# Patient Record
Sex: Male | Born: 1945 | Race: White | Hispanic: Yes | Marital: Married | Smoking: Never smoker
Health system: Southern US, Community
[De-identification: ages and names within clinical notes are randomized; demographics above are authoritative.]

## PROBLEM LIST (undated history)

## (undated) DIAGNOSIS — K5790 Diverticulosis of intestine, part unspecified, without perforation or abscess without bleeding: Secondary | ICD-10-CM

## (undated) HISTORY — DX: Diverticulosis of intestine, part unspecified, without perforation or abscess without bleeding: K57.90

---

## 2010-03-22 ENCOUNTER — Emergency Department (HOSPITAL_BASED_OUTPATIENT_CLINIC_OR_DEPARTMENT_OTHER): Admission: EM | Admit: 2010-03-22 | Discharge: 2010-03-22 | Payer: Self-pay | Admitting: Emergency Medicine

## 2010-03-22 ENCOUNTER — Ambulatory Visit: Payer: Self-pay | Admitting: Diagnostic Radiology

## 2010-07-09 LAB — URINALYSIS, ROUTINE W REFLEX MICROSCOPIC
Glucose, UA: NEGATIVE mg/dL
Protein, ur: NEGATIVE mg/dL
Specific Gravity, Urine: 1.035 — ABNORMAL HIGH (ref 1.005–1.030)
Urobilinogen, UA: 0.2 mg/dL (ref 0.0–1.0)

## 2010-07-09 LAB — DIFFERENTIAL
Basophils Absolute: 0 10*3/uL (ref 0.0–0.1)
Basophils Relative: 1 % (ref 0–1)
Eosinophils Relative: 1 % (ref 0–5)
Lymphocytes Relative: 16 % (ref 12–46)
Monocytes Absolute: 0.5 10*3/uL (ref 0.1–1.0)

## 2010-07-09 LAB — CBC
HCT: 48.8 % (ref 39.0–52.0)
MCH: 31.4 pg (ref 26.0–34.0)
MCHC: 34.8 g/dL (ref 30.0–36.0)
MCV: 90 fL (ref 78.0–100.0)
RDW: 12.6 % (ref 11.5–15.5)

## 2010-07-09 LAB — BASIC METABOLIC PANEL
BUN: 18 mg/dL (ref 6–23)
Chloride: 108 mEq/L (ref 96–112)
Glucose, Bld: 119 mg/dL — ABNORMAL HIGH (ref 70–99)
Potassium: 4.4 mEq/L (ref 3.5–5.1)

## 2010-07-09 LAB — URINE MICROSCOPIC-ADD ON

## 2013-04-28 HISTORY — PX: COLONOSCOPY: SHX174

## 2017-05-13 DIAGNOSIS — E663 Overweight: Secondary | ICD-10-CM | POA: Insufficient documentation

## 2017-05-13 DIAGNOSIS — H9193 Unspecified hearing loss, bilateral: Secondary | ICD-10-CM | POA: Insufficient documentation

## 2017-07-15 DIAGNOSIS — H903 Sensorineural hearing loss, bilateral: Secondary | ICD-10-CM | POA: Insufficient documentation

## 2020-04-12 ENCOUNTER — Emergency Department (HOSPITAL_BASED_OUTPATIENT_CLINIC_OR_DEPARTMENT_OTHER): Payer: Medicare Other

## 2020-04-12 ENCOUNTER — Emergency Department (HOSPITAL_BASED_OUTPATIENT_CLINIC_OR_DEPARTMENT_OTHER)
Admission: EM | Admit: 2020-04-12 | Discharge: 2020-04-12 | Disposition: A | Discharge status: Home / Self Care | Payer: Medicare Other | Attending: Emergency Medicine | Admitting: Emergency Medicine

## 2020-04-12 ENCOUNTER — Other Ambulatory Visit: Payer: Self-pay

## 2020-04-12 ENCOUNTER — Encounter (HOSPITAL_BASED_OUTPATIENT_CLINIC_OR_DEPARTMENT_OTHER): Payer: Self-pay | Admitting: Emergency Medicine

## 2020-04-12 ENCOUNTER — Emergency Department (HOSPITAL_BASED_OUTPATIENT_CLINIC_OR_DEPARTMENT_OTHER)
Admit: 2020-04-12 | Discharge: 2020-04-12 | Disposition: A | Discharge status: Home / Self Care | Payer: Medicare Other | Attending: Emergency Medicine | Admitting: Emergency Medicine

## 2020-04-12 DIAGNOSIS — R1013 Epigastric pain: Secondary | ICD-10-CM | POA: Diagnosis not present

## 2020-04-12 DIAGNOSIS — R112 Nausea with vomiting, unspecified: Secondary | ICD-10-CM | POA: Diagnosis not present

## 2020-04-12 LAB — COMPREHENSIVE METABOLIC PANEL
ALT: 20 U/L (ref 0–44)
AST: 17 U/L (ref 15–41)
Albumin: 4 g/dL (ref 3.5–5.0)
Alkaline Phosphatase: 59 U/L (ref 38–126)
Anion gap: 11 (ref 5–15)
BUN: 18 mg/dL (ref 8–23)
CO2: 26 mmol/L (ref 22–32)
Calcium: 9.1 mg/dL (ref 8.9–10.3)
Chloride: 103 mmol/L (ref 98–111)
Creatinine, Ser: 0.99 mg/dL (ref 0.61–1.24)
GFR, Estimated: >60 mL/min (ref >60)
Glucose, Bld: 152 mg/dL — ABNORMAL HIGH (ref 70–99)
Potassium: 3.8 mmol/L (ref 3.5–5.1)
Sodium: 140 mmol/L (ref 135–145)
Total Bilirubin: 0.6 mg/dL (ref 0.3–1.2)
Total Protein: 6.8 g/dL (ref 6.5–8.1)

## 2020-04-12 LAB — CBC WITH DIFFERENTIAL/PLATELET
Abs Immature Granulocytes: 0.04 10*3/uL (ref 0.00–0.07)
Basophils Absolute: 0 10*3/uL (ref 0.0–0.1)
Basophils Relative: 0 %
Eosinophils Absolute: 0.3 10*3/uL (ref 0.0–0.5)
Eosinophils Relative: 3 %
HCT: 43.7 % (ref 39.0–52.0)
Hemoglobin: 15 g/dL (ref 13.0–17.0)
Immature Granulocytes: 0 %
Lymphocytes Relative: 19 %
Lymphs Abs: 2.1 10*3/uL (ref 0.7–4.0)
MCH: 31.2 pg (ref 26.0–34.0)
MCHC: 34.3 g/dL (ref 30.0–36.0)
MCV: 90.9 fL (ref 80.0–100.0)
Monocytes Absolute: 0.9 10*3/uL (ref 0.1–1.0)
Monocytes Relative: 8 %
Neutro Abs: 7.5 10*3/uL (ref 1.7–7.7)
Neutrophils Relative %: 70 %
Platelets: 181 10*3/uL (ref 150–400)
RBC: 4.81 MIL/uL (ref 4.22–5.81)
RDW: 13.1 % (ref 11.5–15.5)
WBC: 10.9 10*3/uL — ABNORMAL HIGH (ref 4.0–10.5)
nRBC: 0 % (ref 0.0–0.2)

## 2020-04-12 LAB — LIPASE, BLOOD: Lipase: 23 U/L (ref 11–51)

## 2020-04-12 MED ORDER — FENTANYL CITRATE (PF) 100 MCG/2ML IJ SOLN
100.0000 ug | Freq: Once | INTRAMUSCULAR | Status: AC
  Administered 2020-04-12: 100 ug via INTRAVENOUS
  Filled 2020-04-12: qty 2

## 2020-04-12 MED ORDER — PANTOPRAZOLE SODIUM 40 MG IV SOLR
40.0000 mg | Freq: Once | INTRAVENOUS | Status: AC
  Administered 2020-04-12: 40 mg via INTRAVENOUS
  Filled 2020-04-12: qty 40

## 2020-04-12 MED ORDER — IOHEXOL 300 MG/ML  SOLN
100.0000 mL | Freq: Once | INTRAMUSCULAR | Status: AC | PRN
  Administered 2020-04-12: 100 mL via INTRAVENOUS

## 2020-04-12 MED ORDER — ONDANSETRON HCL 4 MG/2ML IJ SOLN
4.0000 mg | Freq: Once | INTRAMUSCULAR | Status: AC
  Administered 2020-04-12: 4 mg via INTRAVENOUS
  Filled 2020-04-12: qty 2

## 2020-04-12 MED ORDER — SODIUM CHLORIDE 0.9 % IV BOLUS
1000.0000 mL | Freq: Once | INTRAVENOUS | Status: AC
  Administered 2020-04-12: 1000 mL via INTRAVENOUS

## 2020-04-12 NOTE — ED Notes (Signed)
ED Provider at bedside. 

## 2020-04-12 NOTE — ED Provider Notes (Signed)
[facility] Provider Note: Lowella Dell, MD, FACEP  CSN: [id number] MRN: [id number] ARRIVAL: 04/12/20 at 0229 ROOM: MH09/MH09   CHIEF COMPLAINT  Abdominal Pain   HISTORY OF PRESENT ILLNESS  04/12/20 2:49 AM Douglas Boyd is a 74 y.o. male with epigastric pain since about 7 PM yesterday evening.  He describes the pain as sharp, worse with movement or palpation.  He has had associated nausea and vomiting but no diarrhea or fever.   History reviewed. No pertinent past medical history.  History reviewed. No pertinent surgical history.  Family History  Problem Relation Age of Onset   Diabetes Mother    Hypertension Mother     Social History   Tobacco Use   Smoking status: Never Smoker   Smokeless tobacco: Never Used  Vaping Use   Vaping Use: Never used  Substance Use Topics   Alcohol use: Yes    Comment: oncein a while    Drug use: Never    Prior to Admission medications   Not on File    Allergies Patient has no known allergies.   REVIEW OF SYSTEMS  Negative except as noted here or in the History of Present Illness.   PHYSICAL EXAMINATION  Initial Vital Signs Blood pressure (!) 146/85, pulse 71, temperature 97.6 F (36.4 C), temperature source Oral, resp. rate 20, height 5\' 6"  (1.676 m), weight 78 kg, SpO2 97 %.  Examination General: Well-developed, well-nourished male in no acute distress; appearance consistent with age of record HENT: normocephalic; atraumatic Eyes: pupils equal, round and reactive to light; extraocular muscles intact Neck: supple Heart: regular rate and rhythm Lungs: clear to auscultation bilaterally Abdomen: soft; nondistended; epigastric tenderness; no gallstones seen on bedside ultrasound; no masses or hepatosplenomegaly; bowel sounds present Extremities: No deformity; full range of motion; pulses normal Neurologic: Awake, alert and oriented; motor function intact in all extremities and symmetric; no facial  droop Skin: Warm and dry Psychiatric: Normal mood and affect   RESULTS  Summary of this visit's results, reviewed and interpreted by myself:   EKG Interpretation  Date/Time:  Thursday April 12 2020 02:36:49 EST Ventricular Rate:  73 PR Interval:    QRS Duration: 114 QT Interval:  421 QTC Calculation: 464 R Axis:   63 Text Interpretation: Sinus rhythm Borderline intraventricular conduction delay No previous ECGs available Confirmed by [id number] (Paula Libra) on 04/12/2020 2:51:09 AM      Laboratory Studies: Results for orders placed or performed during the hospital encounter of 04/12/20 (from the past 24 hour(s))  Lipase, blood     Status: None   Collection Time: 04/12/20  3:05 AM  Result Value Ref Range   Lipase 23 11 - 51 U/L  CBC with Differential/Platelet     Status: Abnormal   Collection Time: 04/12/20  3:05 AM  Result Value Ref Range   WBC 10.9 (H) 4.0 - 10.5 K/uL   RBC 4.81 4.22 - 5.81 MIL/uL   Hemoglobin 15.0 13.0 - 17.0 g/dL   HCT 04/14/20 56.2 - 56.3 %   MCV 90.9 80.0 - 100.0 fL   MCH 31.2 26.0 - 34.0 pg   MCHC 34.3 30.0 - 36.0 g/dL   RDW 89.3 73.4 - 28.7 %   Platelets 181 150 - 400 K/uL   nRBC 0.0 0.0 - 0.2 %   Neutrophils Relative % 70 %   Neutro Abs 7.5 1.7 - 7.7 K/uL   Lymphocytes Relative 19 %   Lymphs Abs 2.1 0.7 - 4.0 K/uL  Monocytes Relative 8 %   Monocytes Absolute 0.9 0.1 - 1.0 K/uL   Eosinophils Relative 3 %   Eosinophils Absolute 0.3 0.0 - 0.5 K/uL   Basophils Relative 0 %   Basophils Absolute 0.0 0.0 - 0.1 K/uL   Immature Granulocytes 0 %   Abs Immature Granulocytes 0.04 0.00 - 0.07 K/uL  Comprehensive metabolic panel     Status: Abnormal   Collection Time: 04/12/20  3:05 AM  Result Value Ref Range   Sodium 140 135 - 145 mmol/L   Potassium 3.8 3.5 - 5.1 mmol/L   Chloride 103 98 - 111 mmol/L   CO2 26 22 - 32 mmol/L   Glucose, Bld 152 (H) 70 - 99 mg/dL   BUN 18 8 - 23 mg/dL   Creatinine, Ser 6.30 0.61 - 1.24 mg/dL   Calcium 9.1 8.9 - 16.0  mg/dL   Total Protein 6.8 6.5 - 8.1 g/dL   Albumin 4.0 3.5 - 5.0 g/dL   AST 17 15 - 41 U/L   ALT 20 0 - 44 U/L   Alkaline Phosphatase 59 38 - 126 U/L   Total Bilirubin 0.6 0.3 - 1.2 mg/dL   GFR, Estimated >10 >93 mL/min   Anion gap 11 5 - 15   Imaging Studies: CT ABDOMEN PELVIS W CONTRAST  Result Date: 04/12/2020 CLINICAL DATA:  Epigastric abdominal pain, nausea, vomiting EXAM: CT ABDOMEN AND PELVIS WITH CONTRAST TECHNIQUE: Multidetector CT imaging of the abdomen and pelvis was performed using the standard protocol following bolus administration of intravenous contrast. CONTRAST:  OMNIPAQUE IOHEXOL 300 MG/ML  SOLN COMPARISON:  03/23/2019 FINDINGS: Lower chest: Mild bibasilar atelectasis. Small hiatal hernia. Mild coronary artery calcification. Visualized heart and pericardium are otherwise unremarkable. Hepatobiliary: Mild hepatic steatosis. No enhancing liver lesion. No intra or extrahepatic biliary ductal dilation. Gallbladder unremarkable. Pancreas: Unremarkable Spleen: Unremarkable Adrenals/Urinary Tract: The adrenal glands are unremarkable. The kidneys are normal in size and position. Multiple simple cortical cysts are seen bilaterally. The kidneys are otherwise unremarkable. Submucosal fatty infiltration of the bladder wall anteriorly is again identified, likely the result of remote inflammation. The bladder is otherwise unremarkable. Stomach/Bowel: Severe sigmoid and mild descending colonic diverticulosis. The stomach, small bowel, and large bowel are otherwise unremarkable. No evidence of obstruction or focal inflammation. The appendix is unremarkable. No free intraperitoneal gas or fluid. Vascular/Lymphatic: Mild aortoiliac atherosclerotic calcification. No aortic aneurysm. No pathologic adenopathy within the abdomen and pelvis. Reproductive: Moderate prostatic enlargement. Seminal vesicles unremarkable. Other: Rectum unremarkable.  Tiny fat containing umbilical hernia. Musculoskeletal:  Degenerative changes are seen within the lumbar spine. No lytic or blastic bone lesions are seen. No acute bone abnormality. IMPRESSION: No acute intra-abdominal pathology identified. No definite radiographic explanation for the patient's reported symptoms. Incidental findings as noted above. Aortic Atherosclerosis (ICD10-I70.0). Electronically Signed   By: Helyn Numbers MD   On: 04/12/2020 04:35    ED COURSE and MDM  Nursing notes, initial and subsequent vitals signs, including pulse oximetry, reviewed and interpreted by myself.  Vitals:   04/12/20 0236 04/12/20 0239 04/12/20 0330 04/12/20 0430  BP:  (!) 146/85 113/83 110/77  Pulse:  71 69 66  Resp:  20 20 14   Temp:  97.6 F (36.4 C)    TempSrc:  Oral    SpO2:  97% 95% 99%  Weight: 78 kg     Height: 5\' 6"  (1.676 m)      Medications  sodium chloride 0.9 % bolus 1,000 mL (0 mLs Intravenous Stopped 04/12/20  0341)  ondansetron (ZOFRAN) injection 4 mg (4 mg Intravenous Given 04/12/20 0300)  fentaNYL (SUBLIMAZE) injection 100 mcg (100 mcg Intravenous Given 04/12/20 0300)  pantoprazole (PROTONIX) injection 40 mg (40 mg Intravenous Given 04/12/20 0300)  iohexol (OMNIPAQUE) 300 MG/ML solution 100 mL (100 mLs Intravenous Contrast Given 04/12/20 0410)   4:44 AM Patient and family advised of reassuring CT and laboratory studies.  Differential diagnosis includes gastritis, peptic ulcer disease, acute viral gastrointestinal illness and biliary colic.  I was unable to see any significant gallstones on bedside ultrasound but will schedule a formal ultrasound to get a better study later today.  He states he had a similar episode of pain about 3 weeks ago but it was not as severe and did not last as long.  He is presently pain-free.  PROCEDURES  Procedures   ED DIAGNOSES     ICD-10-CM   1. Epigastric pain  R10.13   2. Nausea and vomiting in adult  R11.2        Paula Libra, MD 04/12/20 [phone]

## 2020-04-12 NOTE — ED Triage Notes (Signed)
Pt is c/o epigastric pain that started yesterday evening around 7pm  Pt states he has had nausea and vomiting with the pain  Denies any radiation to his back   Describes the pain as sharp in nature

## 2020-04-12 NOTE — ED Notes (Signed)
Patient transported to CT 

## 2020-04-14 ENCOUNTER — Emergency Department (HOSPITAL_BASED_OUTPATIENT_CLINIC_OR_DEPARTMENT_OTHER)
Admission: EM | Admit: 2020-04-14 | Discharge: 2020-04-15 | Disposition: A | Discharge status: Home / Self Care | Payer: Medicare Other | Attending: Emergency Medicine | Admitting: Emergency Medicine

## 2020-04-14 ENCOUNTER — Encounter (HOSPITAL_BASED_OUTPATIENT_CLINIC_OR_DEPARTMENT_OTHER): Payer: Self-pay | Admitting: Emergency Medicine

## 2020-04-14 ENCOUNTER — Other Ambulatory Visit: Payer: Self-pay

## 2020-04-14 DIAGNOSIS — R1013 Epigastric pain: Secondary | ICD-10-CM | POA: Insufficient documentation

## 2020-04-14 LAB — CBC
HCT: 43.6 % (ref 39.0–52.0)
Hemoglobin: 15.1 g/dL (ref 13.0–17.0)
MCH: 30.8 pg (ref 26.0–34.0)
MCHC: 34.6 g/dL (ref 30.0–36.0)
MCV: 88.8 fL (ref 80.0–100.0)
Platelets: 228 10*3/uL (ref 150–400)
RBC: 4.91 MIL/uL (ref 4.22–5.81)
RDW: 13.2 % (ref 11.5–15.5)
WBC: 9.9 10*3/uL (ref 4.0–10.5)
nRBC: 0 % (ref 0.0–0.2)

## 2020-04-14 LAB — TROPONIN I (HIGH SENSITIVITY): Troponin I (High Sensitivity): 3 ng/L (ref <18)

## 2020-04-14 LAB — BASIC METABOLIC PANEL
Anion gap: 11 (ref 5–15)
BUN: 14 mg/dL (ref 8–23)
CO2: 23 mmol/L (ref 22–32)
Calcium: 8.7 mg/dL — ABNORMAL LOW (ref 8.9–10.3)
Chloride: 106 mmol/L (ref 98–111)
Creatinine, Ser: 0.88 mg/dL (ref 0.61–1.24)
GFR, Estimated: >60 mL/min (ref >60)
Glucose, Bld: 145 mg/dL — ABNORMAL HIGH (ref 70–99)
Potassium: 3.5 mmol/L (ref 3.5–5.1)
Sodium: 140 mmol/L (ref 135–145)

## 2020-04-14 MED ORDER — ALUM & MAG HYDROXIDE-SIMETH 200-200-20 MG/5ML PO SUSP
30.0000 mL | Freq: Once | ORAL | Status: AC
Start: 2020-04-14 — End: 2020-04-14
  Administered 2020-04-14: 30 mL via ORAL
  Filled 2020-04-14: qty 30

## 2020-04-14 MED ORDER — FENTANYL CITRATE (PF) 100 MCG/2ML IJ SOLN
50.0000 ug | INTRAMUSCULAR | Status: AC | PRN
  Administered 2020-04-14: 50 ug via INTRAVENOUS
  Filled 2020-04-14: qty 2

## 2020-04-14 MED ORDER — FENTANYL CITRATE (PF) 100 MCG/2ML IJ SOLN
INTRAMUSCULAR | Status: AC
  Administered 2020-04-14: 50 ug via INTRAVENOUS
  Filled 2020-04-14: qty 2

## 2020-04-14 MED ORDER — LIDOCAINE VISCOUS HCL 2 % MT SOLN
15.0000 mL | Freq: Once | OROMUCOSAL | Status: AC
  Administered 2020-04-14: 15 mL via ORAL
  Filled 2020-04-14: qty 15

## 2020-04-14 MED ORDER — HYOSCYAMINE SULFATE 0.125 MG SL SUBL
0.2500 mg | SUBLINGUAL_TABLET | Freq: Once | SUBLINGUAL | Status: AC
  Administered 2020-04-14: 0.25 mg via SUBLINGUAL
  Filled 2020-04-14: qty 2

## 2020-04-14 MED ORDER — ONDANSETRON HCL 4 MG/2ML IJ SOLN
4.0000 mg | Freq: Once | INTRAMUSCULAR | Status: AC
  Administered 2020-04-14: 4 mg via INTRAVENOUS

## 2020-04-14 NOTE — ED Triage Notes (Addendum)
Reports epigastric pain onset tonight, reports seen a couple of days ago for the same. Reports worse tonight, diaphoretic, dizzy.

## 2020-04-14 NOTE — ED Notes (Addendum)
Notified MD of pt continuing to endorse CP. Pt vitals stable. Repeated EKG

## 2020-04-14 NOTE — ED Provider Notes (Signed)
[facility] EMERGENCY DEPARTMENT Provider Note  CSN: [id number] Arrival date & time: 04/14/20 2056  Chief Complaint(s) Chest Pain  HPI Douglas Boyd is a 74 y.o. male   HPI Patient presents today for recurrent epigastric abdominal pain. Pain started around 7 PM this afternoon and has been constant since. Sharp and severe in nature Pain does not radiate. No alleviating or aggravating factors. Associated nausea and nonbloody nonbilious emesis. No chest pain or shortness of breath. No diarrhea or change in bowel habits.  No dysuria.  No other physical complaints.  Patient was seen a couple days ago for the same and had reassuring work-up notable only for leukocytosis.  No evidence of biliary obstruction or pancreatitis at that time.  CT scan was unremarkable.  Past Medical History History reviewed. No pertinent past medical history. There are no problems to display for this patient.  Home Medication(s) Prior to Admission medications   Medication Sig Start Date End Date Taking? Authorizing Provider  lidocaine (XYLOCAINE) 2 % solution Use as directed 15 mLs in the mouth or throat every 6 (six) hours as needed for mouth pain. 04/15/20   Nira Conn, MD  ondansetron (ZOFRAN ODT) 4 MG disintegrating tablet Take 1 tablet (4 mg total) by mouth every 8 (eight) hours as needed for up to 3 days for nausea or vomiting. 04/15/20 04/18/20  Marlyne Totaro, Amadeo Garnet, MD  sucralfate (CARAFATE) 1 GM/10ML suspension Take 10 mLs (1 g total) by mouth 4 (four) times daily -  with meals and at bedtime. 04/15/20   Nira Conn, MD                                                                                                                                    Past Surgical History History reviewed. No pertinent surgical history. Family History Family History  Problem Relation Age of Onset  . Diabetes Mother   . Hypertension Mother     Social History Social History    Tobacco Use  . Smoking status: Never Smoker  . Smokeless tobacco: Never Used  Vaping Use  . Vaping Use: Never used  Substance Use Topics  . Alcohol use: Yes    Comment: oncein a while   . Drug use: Never   Allergies Patient has no known allergies.  Review of Systems Review of Systems All other systems are reviewed and are negative for acute change except as noted in the HPI  Physical Exam Vital Signs  I have reviewed the triage vital signs BP 123/83   Pulse 67   Temp 98 F (36.7 C) (Oral)   Resp 18   SpO2 98%   Physical Exam Vitals reviewed.  Constitutional:      General: He is not in acute distress.    Appearance: He is well-developed and well-nourished. He is not diaphoretic.  HENT:     Head: Normocephalic and atraumatic.     Jaw:  No trismus.     Right Ear: External ear normal.     Left Ear: External ear normal.     Nose: Nose normal.     Mouth/Throat:     Mouth: Mucous membranes are normal.  Eyes:     General: No scleral icterus.    Extraocular Movements: EOM normal.     Conjunctiva/sclera: Conjunctivae normal.  Neck:     Trachea: Phonation normal.  Cardiovascular:     Rate and Rhythm: Normal rate and regular rhythm.  Pulmonary:     Effort: Pulmonary effort is normal. No respiratory distress.     Breath sounds: No stridor.  Abdominal:     General: There is no distension.     Tenderness: There is abdominal tenderness in the epigastric area. There is no right CVA tenderness, left CVA tenderness or rebound. Negative signs include Murphy's sign.  Musculoskeletal:        General: No edema. Normal range of motion.     Cervical back: Normal range of motion.  Neurological:     Mental Status: He is alert and oriented to person, place, and time.  Psychiatric:        Mood and Affect: Mood and affect normal.        Behavior: Behavior normal.     ED Results and Treatments Labs (all labs ordered are listed, but only abnormal results are displayed) Labs  Reviewed  BASIC METABOLIC PANEL - Abnormal; Notable for the following components:      Result Value   Glucose, Bld 145 (*)    Calcium 8.7 (*)    All other components within normal limits  HEPATIC FUNCTION PANEL - Abnormal; Notable for the following components:   Bilirubin, Direct 0.3 (*)    All other components within normal limits  CBC  LIPASE, BLOOD  TROPONIN I (HIGH SENSITIVITY)  TROPONIN I (HIGH SENSITIVITY)                                                                                                                         EKG  EKG Interpretation  Date/Time:  Saturday April 14 2020 21:55:51 EST Ventricular Rate:  54 PR Interval:    QRS Duration: 111 QT Interval:  469 QTC Calculation: 445 R Axis:   63 Text Interpretation: Age not entered, assumed to be  74 years old for purpose of ECG interpretation Sinus rhythm RSR' in V1 or V2, probably normal variant Confirmed by Drema Pryardama, Tirth Cothron [phone]([id number]) on 04/14/2020 11:03:37 PM      Radiology CT Angio Chest/Abd/Pel for Dissection W and/or Wo Contrast  Result Date: 04/15/2020 CLINICAL DATA:  Abdominal pain. EXAM: CT ANGIOGRAPHY CHEST, ABDOMEN AND PELVIS TECHNIQUE: Non-contrast CT of the chest was initially obtained. Multidetector CT imaging through the chest, abdomen and pelvis was performed using the standard protocol during bolus administration of intravenous contrast. Multiplanar reconstructed images and MIPs were obtained and reviewed to evaluate the vascular anatomy. CONTRAST:  100mL OMNIPAQUE IOHEXOL 350 MG/ML SOLN COMPARISON:  April 12, 2020 FINDINGS: CTA CHEST FINDINGS Cardiovascular: There is marked severity calcification of the aortic arch. Satisfactory opacification of the pulmonary arteries to the segmental level. No evidence of pulmonary embolism. Normal heart size with moderate to marked severity coronary artery calcification. No pericardial effusion. Mediastinum/Nodes: No enlarged mediastinal, hilar, or axillary lymph  nodes. Thyroid gland, trachea, and esophagus demonstrate no significant findings. Lungs/Pleura: Mild atelectasis is seen within the posterior aspect of the right lower lobe. There is no evidence of a pleural effusion or pneumothorax. Musculoskeletal: No chest wall abnormality. No acute or significant osseous findings. Review of the MIP images confirms the above findings. CTA ABDOMEN AND PELVIS FINDINGS VASCULAR Aorta: Normal caliber aorta without aneurysm, dissection, vasculitis or significant stenosis. Celiac: Patent without evidence of aneurysm, dissection, vasculitis or significant stenosis. SMA: Patent without evidence of aneurysm, dissection, vasculitis or significant stenosis. Renals: Both renal arteries are patent without evidence of aneurysm, dissection, vasculitis, fibromuscular dysplasia or significant stenosis. IMA: Patent without evidence of aneurysm, dissection, vasculitis or significant stenosis. Inflow: Moderate-severity calcification and atherosclerosis without evidence of aneurysm, dissection, vasculitis or significant stenosis. Veins: No obvious venous abnormality within the limitations of this arterial phase study. Review of the MIP images confirms the above findings. NON-VASCULAR Hepatobiliary: No focal liver abnormality is seen. No gallstones, gallbladder wall thickening, or biliary dilatation. Pancreas: Unremarkable. No pancreatic ductal dilatation or surrounding inflammatory changes. Spleen: Normal in size without focal abnormality. Adrenals/Urinary Tract: Adrenal glands are unremarkable. Kidneys are normal in size, without renal calculi or hydronephrosis. A 2.7 cm x 2.3 cm cyst is seen within the lower pole of the right kidney. A 2.4 cm x 2.1 cm exophytic cyst is seen adjacent to the lateral aspect of the mid left kidney. Bladder is unremarkable. Stomach/Bowel: There is a small to moderate sized hiatal hernia. Appendix appears normal. No evidence of bowel wall thickening, distention, or  inflammatory changes. Noninflamed diverticula are seen throughout the sigmoid colon. Lymphatic: No abnormal abdominal or pelvic lymph nodes are identified. Reproductive: There is moderate to marked severity prostate gland enlargement. Other: No abdominal wall hernia or abnormality. No abdominopelvic ascites. Musculoskeletal: Degenerative changes seen throughout the lumbar spine, most prominent at the level of L5-S1. Review of the MIP images confirms the above findings. IMPRESSION: 1. No evidence of pulmonary embolism. 2. Noninflamed diverticula throughout the sigmoid colon. 3. Moderate to marked severity prostate gland enlargement. 4. Aortic atherosclerosis. Aortic Atherosclerosis (ICD10-I70.0). Electronically Signed   By: Aram Candela M.D.   On: 04/15/2020 02:45    Pertinent labs & imaging results that were available during my care of the patient were reviewed by me and considered in my medical decision making (see chart for details).  Medications Ordered in ED Medications  sodium chloride 0.9 % bolus 1,000 mL (0 mLs Intravenous Stopped 04/15/20 0243)    Followed by  0.9 %  sodium chloride infusion (1,000 mLs Intravenous New Bag/Given 04/15/20 0254)  sucralfate (CARAFATE) 1 GM/10ML suspension 1 g (has no administration in time range)  fentaNYL (SUBLIMAZE) injection 50 mcg (50 mcg Intravenous Given 04/14/20 2145)  alum & mag hydroxide-simeth (MAALOX/MYLANTA) 200-200-20 MG/5ML suspension 30 mL (30 mLs Oral Given 04/14/20 2322)    And  lidocaine (XYLOCAINE) 2 % viscous mouth solution 15 mL (15 mLs Oral Given 04/14/20 2322)  hyoscyamine (LEVSIN SL) SL tablet 0.25 mg (0.25 mg Sublingual Given 04/14/20 2321)  ondansetron (ZOFRAN) injection 4 mg (4 mg Intravenous Given 04/14/20 2320)  pantoprazole (PROTONIX) injection 40 mg (40 mg Intravenous Given 04/15/20  0023)  HYDROmorphone (DILAUDID) injection 0.5 mg (0.5 mg Intravenous Given 04/15/20 0023)  HYDROmorphone (DILAUDID) injection 0.5 mg (0.5 mg  Intravenous Given 04/15/20 0200)  iohexol (OMNIPAQUE) 350 MG/ML injection 100 mL (100 mLs Intravenous Contrast Given 04/15/20 0218)                                                                                                                                    Procedures Procedures  (including critical care time)  Medical Decision Making / ED Course I have reviewed the nursing notes for this encounter and the patient's prior records (if available in EHR or on provided paperwork).   Kaysin Brock was evaluated in Emergency Department on 04/15/2020 for the symptoms described in the history of present illness. He was evaluated in the context of the global COVID-19 pandemic, which necessitated consideration that the patient might be at risk for infection with the SARS-CoV-2 virus that causes COVID-19. Institutional protocols and algorithms that pertain to the evaluation of patients at risk for COVID-19 are in a state of rapid change based on information released by regulatory bodies including the CDC and federal and state organizations. These policies and algorithms were followed during the patient's care in the ED.  Recurrent epigastric pain described as sharp Tenderness to palpation in the epigastrium. Likely GI related.  Labs grossly reassuring without leukocytosis which is improved from prior.  No anemia.  No significant electrolyte derangements or renal sufficiency.  No evidence of biliary obstruction or pancreatitis.  Patient treated symptomatically with GI cocktail and antiemetics.  Given age, cardiac work-up was obtained.  EKG without acute ischemic changes or evidence of pericarditis.  Serial troponins negative x2.  I feel that this is sufficient to rule out cardiac process.  Patient reported minimal relief with GI cocktail.  He was given IV narcotics and Protonix. Patient's CT scan during his last visit was relatively unremarkable. Given his persistent pain with minimal relief from  the above, I obtain a CTA to assess for possible celiac artery dissection.  CTA was negative for any acute process.  Patient's pain did improve but did not completely resolved.  I see no evidence of serious process at this time.  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  Patient already has PPI at home.  Will DC home with Carafate, viscous lidocaine and antiemetics.  Recommended PCP and GI follow-up.     Final Clinical Impression(s) / ED Diagnoses Final diagnoses:  Epigastric pain   The patient appears reasonably screened and/or stabilized for discharge and I doubt any other medical condition or other [facility] requiring further screening, evaluation, or treatment in the ED at this time prior to discharge. Safe for discharge with strict return precautions.  Disposition: Discharge  Condition: Good  I have discussed the results, Dx and Tx plan with the patient/family who expressed understanding and agree(s) with the plan. Discharge instructions discussed at length. The patient/family was given strict return precautions  who verbalized understanding of the instructions. No further questions at time of discharge.    ED Discharge Orders         Ordered    sucralfate (CARAFATE) 1 GM/10ML suspension  3 times daily with meals & bedtime        04/15/20 0406    lidocaine (XYLOCAINE) 2 % solution  Every 6 hours PRN        04/15/20 0406    ondansetron (ZOFRAN ODT) 4 MG disintegrating tablet  Every 8 hours PRN        04/15/20 0406           Follow Up: Andreas Blower., MD [address] [place] [address] [phone]  Call  To schedule an appointment for close follow up  Gastroenterology, [place] Sprang [address] [place] [address] [phone]  Call  To schedule an appointment for close follow up      This chart was dictated using voice recognition software.  Despite best efforts to proofread,  errors can occur which can change the documentation meaning.   Nira Conn, MD 04/15/20 [phone]

## 2020-04-15 ENCOUNTER — Emergency Department (HOSPITAL_BASED_OUTPATIENT_CLINIC_OR_DEPARTMENT_OTHER): Payer: Medicare Other

## 2020-04-15 ENCOUNTER — Encounter (HOSPITAL_BASED_OUTPATIENT_CLINIC_OR_DEPARTMENT_OTHER): Payer: Self-pay

## 2020-04-15 DIAGNOSIS — R1013 Epigastric pain: Secondary | ICD-10-CM | POA: Diagnosis not present

## 2020-04-15 LAB — HEPATIC FUNCTION PANEL
ALT: 34 U/L (ref 0–44)
AST: 30 U/L (ref 15–41)
Albumin: 4.1 g/dL (ref 3.5–5.0)
Alkaline Phosphatase: 59 U/L (ref 38–126)
Bilirubin, Direct: 0.3 mg/dL — ABNORMAL HIGH (ref 0.0–0.2)
Indirect Bilirubin: 0.5 mg/dL (ref 0.3–0.9)
Total Bilirubin: 0.8 mg/dL (ref 0.3–1.2)
Total Protein: 6.8 g/dL (ref 6.5–8.1)

## 2020-04-15 LAB — TROPONIN I (HIGH SENSITIVITY): Troponin I (High Sensitivity): 2 ng/L (ref <18)

## 2020-04-15 LAB — LIPASE, BLOOD: Lipase: 20 U/L (ref 11–51)

## 2020-04-15 MED ORDER — ONDANSETRON 4 MG PO TBDP: 4.0000 mg | ORAL_TABLET | Freq: Three times a day (TID) | ORAL | 0 refills | Status: AC | PRN

## 2020-04-15 MED ORDER — SUCRALFATE 1 GM/10ML PO SUSP: 1.0000 g | Freq: Three times a day (TID) | ORAL | 0 refills | Status: DC

## 2020-04-15 MED ORDER — SUCRALFATE 1 GM/10ML PO SUSP: 1.0000 g | Freq: Three times a day (TID) | ORAL | Status: DC

## 2020-04-15 MED ORDER — HYDROMORPHONE HCL 1 MG/ML IJ SOLN
0.5000 mg | Freq: Once | INTRAMUSCULAR | Status: AC
Start: 2020-04-15 — End: 2020-04-15
  Administered 2020-04-15: 0.5 mg via INTRAVENOUS
  Filled 2020-04-15: qty 1

## 2020-04-15 MED ORDER — IOHEXOL 350 MG/ML SOLN
100.0000 mL | Freq: Once | INTRAVENOUS | Status: AC | PRN
  Administered 2020-04-15: 100 mL via INTRAVENOUS

## 2020-04-15 MED ORDER — LIDOCAINE VISCOUS HCL 2 % MT SOLN: 15.0000 mL | Freq: Four times a day (QID) | OROMUCOSAL | 0 refills | Status: DC | PRN

## 2020-04-15 MED ORDER — SODIUM CHLORIDE 0.9 % IV BOLUS (SEPSIS)
1000.0000 mL | Freq: Once | INTRAVENOUS | Status: AC
  Administered 2020-04-15: 1000 mL via INTRAVENOUS

## 2020-04-15 MED ORDER — SODIUM CHLORIDE 0.9 % IV SOLN
1000.0000 mL | INTRAVENOUS | Status: DC
  Administered 2020-04-15: 1000 mL via INTRAVENOUS

## 2020-04-15 MED ORDER — PANTOPRAZOLE SODIUM 40 MG IV SOLR
40.0000 mg | Freq: Once | INTRAVENOUS | Status: AC
  Administered 2020-04-15: 40 mg via INTRAVENOUS
  Filled 2020-04-15: qty 40

## 2020-04-15 MED ORDER — SUCRALFATE 1 GM/10ML PO SUSP
1.0000 g | Freq: Once | ORAL | Status: AC
  Administered 2020-04-15: 1 g via ORAL
  Filled 2020-04-15: qty 10

## 2020-04-15 NOTE — ED Notes (Signed)
Pt continues to endorse 10/10 pain, notified MD, pt family @ bedside.

## 2020-04-15 NOTE — ED Notes (Signed)
Patient transported to CT 

## 2020-04-25 ENCOUNTER — Encounter: Payer: Self-pay | Admitting: Nurse Practitioner

## 2020-05-16 ENCOUNTER — Other Ambulatory Visit: Payer: Self-pay

## 2020-05-16 ENCOUNTER — Encounter: Payer: Self-pay | Admitting: Nurse Practitioner

## 2020-05-16 ENCOUNTER — Ambulatory Visit (INDEPENDENT_AMBULATORY_CARE_PROVIDER_SITE_OTHER): Payer: Medicare Other | Admitting: Nurse Practitioner

## 2020-05-16 VITALS — BP 120/86 | HR 80 | Ht 66.0 in | Wt 171.0 lb

## 2020-05-16 DIAGNOSIS — R1013 Epigastric pain: Secondary | ICD-10-CM | POA: Diagnosis not present

## 2020-05-16 DIAGNOSIS — K219 Gastro-esophageal reflux disease without esophagitis: Secondary | ICD-10-CM

## 2020-05-16 DIAGNOSIS — K828 Other specified diseases of gallbladder: Secondary | ICD-10-CM

## 2020-05-16 MED ORDER — OMEPRAZOLE 20 MG PO CPDR: DELAYED_RELEASE_CAPSULE | ORAL | 6 refills | Status: DC

## 2020-05-16 NOTE — Progress Notes (Addendum)
ASSESSMENT AND PLAN    # 75 yo male with chronic GERD manifested as nocturnal heartburn. Treated himself for years with Tums. Symptoms have resolved after starting Omeprazole in mid December.  --anti-reflux measures, wedge pillow or elevate head of bed --continue daily Omeprazole 20 mg Asked him to take 30 minutes before dinner since symptoms are nocturnal.  --We discussed EGD for Barrett's esophagus screening and also evaluation of recent upper abdominal pain. He declines Barrett's screening for now.  # Epigastric pain, resolved.  Went to ED twice in mid December, EKG, labs, CT scan/CT angiogram unrevealing for source of pain.  However, ultrasound did suggest sludge/debris in the gallbladder. Pain has resolved with omeprazole 20 mg daily.  Etiology of pain is unclear. Peptic? Biliary?   --Patient will call us if he gets recurrent epigastric pain.  He understands it will warrant further evaluation, possibly EGD and /or surgical evaluation given US findings.  Otherwise follow up here in a few months  # Colon cancer screening. Reports normal colonoscopy ~ 5 years ago in [facility]  --Will request records and once reviewed with place him on colonoscopy recall list   ADDENDUM 07/17/20:  Reviewed records from Dr. Conley Rolls in [facility].  Colonoscopy 06/13/2013 by Dr. Conley Rolls Complete exam, excellent prep --No polyps.  Mild sigmoid diverticulosis, grade 1 internal hemorrhoids --Follow-up colonoscopy in 10 years   # Renal cysts on recent imaging.    HISTORY OF PRESENT ILLNESS     Primary Gastroenterologist : new - Marsa Aris, MD  Chief Complaint : upper abdominal pain, now resolved.   Douglas Boyd is a 75 y.o. male with PMH / PSH significant for,  but not necessarily limited to: Severe prostamegaly, diverticulosis, GERD, hiatal hernia, kidney stones, aortic atherosclerosis  Patient referred by Shelbie Proctor, MD. He was seen in the ED in 04/12/20 for epigastric pain. He says he  doesn't know exactly when the pain started. Wife says she thinks it started about two weeks prior to ED visit. The pain got progressively worse over several days.   In ED his Troponin was normal. EKG showed sinus rhythm, borderline intraventricular conduction delay.  His lipase and LFTs were normal, white count 10.9, hemoglobin 15 .  Marland Kitchen RUQ US showed sludge and debris in the gallbladder, a 5 mm gallbladder polyp versus adherent gallstone.  No evidence for acute cholecystitis.  No biliary duct dilation.   No acute findings on CT scan of the abdomen and pelvis with contrast. He was discharged home on a PPI and an antiemetic. Pain improved but recurred two days later and patient returned to the ED on 12/18.  Repeat lipase was still normal.  LFTs were normal except for a trivial elevation in direct bilirubin to 0.3.  Troponin was still normal..  CBC was normal .  CT angio of the chest abdomen and pelvis negative for hepatobiliary findings.  There was moderate to marked coronary artery calcifications, no abnormalities of the pancreas.  He had a small to moderate size hiatal hernia.  Diverticulosis without evidence for diverticulitis patient discharged from the ED with Carafate  Patient points to the epigastrium as the location of where he was having the pain.  He describes radiation of pain across his whole upper abdomen but not into the back.  He recalls being constipated with the upper abdominal pain.  He had not had a bowel movement for 4 days prior to going to ED. He took "something" for constipation after leaving ED  and was able to have a BM.  Since then his BMs have been back to normal and hasn't had any further abdomianl pain. However,he is taking the PPI and has made some dietary changes.  Drinking less coffee, not eating spicy foods. Patient denies NSAID use. He gives a longstanding history of nocturnal heartburn for which he used to take Tums on a daily basis.  Since starting omeprazole he has not needed any  Tums . He has no dysphagia. No prior history of an EGD.    Patient says he had a colonoscopy in [place] about 5 years ago. Told he needed another in 10 years. No FMH of colon cancer.     Past Medical History:  Diagnosis Date  . Diverticulosis      No past surgical history on file. Family History  Problem Relation Age of Onset  . Diabetes Mother   . Hypertension Mother    Social History   Tobacco Use  . Smoking status: Never Smoker  . Smokeless tobacco: Never Used  Vaping Use  . Vaping Use: Never used  Substance Use Topics  . Alcohol use: Yes    Comment: oncein a while   . Drug use: Never   Current Outpatient Medications  Medication Sig Dispense Refill  . alum & mag hydroxide-simeth (MYLANTA) 200-200-20 MG/5ML suspension Take 15 mLs by mouth as needed for indigestion or heartburn.    . lidocaine (XYLOCAINE) 2 % solution Use as directed 15 mLs in the mouth or throat every 6 (six) hours as needed for mouth pain. 100 mL 0  . omeprazole (PRILOSEC) 20 MG capsule Take 20 mg by mouth daily.     No current facility-administered medications for this visit.   No Known Allergies   Review of Systems: Positive for sinus trouble/allergy trouble.  All other systems reviewed and negative except where noted in HPI.   PHYSICAL EXAM :    Wt Readings from Last 3 Encounters:  05/16/20 171 lb (77.6 kg)  04/12/20 172 lb (78 kg)    BP 120/86   Pulse 80   Ht 5\' 6"  (1.676 m)   Wt 171 lb (77.6 kg)   BMI 27.60 kg/m  Constitutional:  Pleasant male in no acute distress. Psychiatric: Normal mood and affect. Behavior is normal. EENT: Pupils normal.  Conjunctivae are normal. No scleral icterus. Neck supple.  Cardiovascular: Normal rate, regular rhythm. No edema Pulmonary/chest: Effort normal and breath sounds normal. No wheezing, rales or rhonchi. Abdominal: Soft, nondistended, nontender. Bowel sounds active throughout. There are no masses palpable. No hepatomegaly. Neurological:  Alert and oriented to person place and time. Skin: Skin is warm and dry. No rashes noted.  , NP  05/16/2020, 11:43 AM  Cc:  Referring Provider 05/18/2020., MD

## 2020-05-16 NOTE — Patient Instructions (Addendum)
If you are age 73 or older, your body mass index should be between 23-30. Your Body mass index is 27.6 kg/m. If this is out of the aforementioned range listed, please consider follow up with your Primary Care Provider.  If you are age 46 or younger, your body mass index should be between 19-25. Your Body mass index is 27.6 kg/m. If this is out of the aformentioned range listed, please consider follow up with your Primary Care Provider.   Continue Omeprazole 20 mg 1 capsule 30 minutes prior to dinner. Refills have been sent to your pharmacy.  Call the office if you have any recurrent heartburn or upper abdominal pain.  Contact the office in a few months to schedule a 6 month follow up.  Thank you for entrusting me with your care and choosing [facility].  Willette Cluster, NP-C

## 2020-06-25 NOTE — Progress Notes (Signed)
Reviewed and agree with documentation and assessment and plan. K. Veena Yen Wandell , MD   

## 2020-12-08 ENCOUNTER — Other Ambulatory Visit: Payer: Self-pay | Admitting: Nurse Practitioner

## 2021-01-16 ENCOUNTER — Ambulatory Visit: Payer: Medicare Other | Admitting: Nurse Practitioner

## 2021-02-20 ENCOUNTER — Ambulatory Visit: Payer: Medicare Other | Admitting: Nurse Practitioner

## 2021-02-28 ENCOUNTER — Ambulatory Visit (INDEPENDENT_AMBULATORY_CARE_PROVIDER_SITE_OTHER): Payer: Medicare Other | Admitting: Nurse Practitioner

## 2021-02-28 ENCOUNTER — Encounter: Payer: Self-pay | Admitting: Nurse Practitioner

## 2021-02-28 VITALS — BP 124/76 | HR 81 | Ht 66.0 in | Wt 167.6 lb

## 2021-02-28 DIAGNOSIS — K219 Gastro-esophageal reflux disease without esophagitis: Secondary | ICD-10-CM

## 2021-02-28 NOTE — Patient Instructions (Signed)
If you are age 73 or older, your body mass index should be between 23-30. Your Body mass index is 27.05 kg/m. If this is out of the aforementioned range listed, please consider follow up with your Primary Care Provider.  The [facility] providers would like to encourage you to use [facility] to communicate with providers for non-urgent requests or questions.  Due to long hold times on the telephone, sending your provider a message by [facility] may be faster and more efficient way to get a response. Please allow 48 business hours for a response.  Please remember that this is for non-urgent requests/questions.  RECOMMENDATIONS: Stop Omeprazole.  Start Pepcid 20 MG daily in the morning.  If your symptoms return, stop the Pepcid and resume the Omeprazole.  Follow up in 1 year or as needed.  It was great seeing you today! Thank you for entrusting me with your care and choosing [facility].  Willette Cluster, NP

## 2021-02-28 NOTE — Progress Notes (Signed)
ASSESSMENT AND PLAN    #75 year old male with chronic GERD /small to moderate size hiatal hernia on CT scan.  manifested as nocturnal pyrosis.  Symptoms controlled after starting omeprazole December 2021  -- Again we discussed antireflux measures.  -- I think it is reasonable to see if GERD symptoms can be controlled with famotidine 20 mg daily   We briefly discussed potential side effects of long-term PPI.  Should he have recurrent GERD symptoms then advised to stop famotidine and resume daily omeprazole 30 minutes before breakfast  -- Again we discussed Barrett's esophagus screening.  He declines --Follow-up in 1 year or sooner if needed  #Colon cancer screening.  Due for 10-year screening colonoscopy February 2025   HISTORY OF PRESENT ILLNESS    Chief Complaint : Follow-up on GERD  Douglas Boyd is a 75 y.o. male known to Dr. Lavon Paganini with a past medical history significant for severe prostamegaly, renal cysts, diverticulosis, GERD, hiatal hernia, kidney stones, aortic atherosclerosis. See PMH below for any additional medical problems.    Patient established care here mid January for evaluation of epigastric pain for which he had been evaluated in the ED the month prior.  ED work-up essentially unremarkable except for an ultrasound suggesting sludge/debris in the gallbladder.  By the time of her visit his pain had already resolved on omeprazole.  Also addressed during the same visit was patient's history of chronic GERD.  He has a long history of nocturnal heartburn treated with Tums.  Symptoms had totally resolved after starting omeprazole the month prior.  I offered him an upper endoscopy for a Barrett's esophagus screening given his longstanding history of GERD but he declined..  I was able to get his colonoscopy report from [facility] done in February 2015 with findings of mild diverticulosis and internal hemorrhoids.  No polyps.  10-year screening colonoscopy recommended  INTERVAL  HISTORY: Mr Douglas Boyd has returned for follow up.  He continues to feel great.  No GERD symptoms.  He never did have a recurrence of epigastric pain.  He has been able to lose a few pounds.  He is still taking omeprazole every morning. He is also drinking a small amount of apple cider vinegar every morning. He goes to bed on an empty stomach.  He does drink 2 to 3 cups of coffee in the morning.     PREVIOUS ENDOSCOPIC EVALUATIONS / PERTINENT STUDIES:   Reviewed records from Dr. Conley Rolls in [facility].  Colonoscopy 06/13/2013 by Dr. Conley Rolls Complete exam, excellent prep --No polyps.  Mild sigmoid diverticulosis, grade 1 internal hemorrhoids --Follow-up colonoscopy in 10 years    Past Medical History:  Diagnosis Date   Diverticulosis     Current Medications, Allergies, Past Surgical History, Family History and Social History were reviewed in [employer] record.   Current Outpatient Medications  Medication Sig Dispense Refill   omeprazole (PRILOSEC) 20 MG capsule TAKE 1 CAPSULE BY MOUTH EVERY DAY 30 MINUTES PRIOR TO DINNER 90 capsule 2   No current facility-administered medications for this visit.    Review of Systems: No chest pain. No shortness of breath. No urinary complaints.   PHYSICAL EXAM :    Wt Readings from Last 3 Encounters:  02/28/21 167 lb 9.6 oz (76 kg)  05/16/20 171 lb (77.6 kg)  04/12/20 172 lb (78 kg)    BP 124/76   Pulse 81   Ht 5\' 6"  (1.676 m)   Wt 167 lb 9.6 oz (76  kg)   SpO2 99%   BMI 27.05 kg/m  Constitutional:  Generally well appearing male in no acute distress. Psychiatric: Pleasant. Normal mood and affect. Behavior is normal. EENT: Pupils normal.  Conjunctivae are normal. No scleral icterus. Neck supple.  Cardiovascular: Normal rate, regular rhythm. No edema Pulmonary/chest: Effort normal and breath sounds normal. No wheezing, rales or rhonchi. Abdominal: Soft, nondistended, nontender. Bowel sounds active throughout. There are no  masses palpable. No hepatomegaly. Neurological: Alert and oriented to person place and time. Skin: Skin is warm and dry. No rashes noted.  Willette Cluster, NP  02/28/2021, 4:59 PM

## 2021-05-07 NOTE — Progress Notes (Signed)
Reviewed and agree with documentation and assessment and plan. K. Veena Kristy Schomburg , MD   

## 2021-11-23 IMAGING — CT CT ABD-PELV W/ CM
2 of 5 series · 16 of 46 positions shown, 18 images · IV contrast (Omnipaque)
Comparison: 03/23/2019

CLINICAL DATA: Epigastric abdominal pain, nausea, vomiting

EXAM:
CT ABDOMEN AND PELVIS WITH CONTRAST
TECHNIQUE: Multidetector CT imaging of the abdomen and pelvis was performed
using the standard protocol following bolus administration of
intravenous contrast.
CONTRAST:  100mL OMNIPAQUE IOHEXOL 300 MG/ML  SOLN

[Series 2: axial st · axial · 0.75mm/px · z∈[+728,+1153]mm · 13 of 95 slices shown, 15 images]
[im 5/95  soft-tissue]
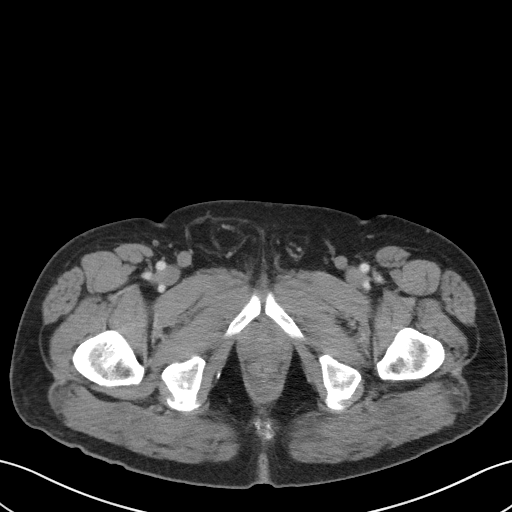
[im 5/95  bone]
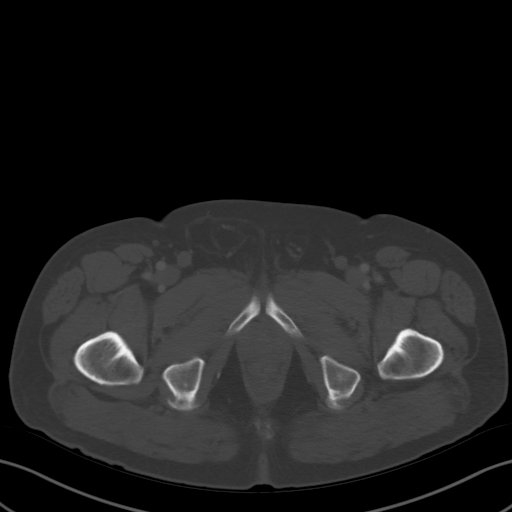
[im 15/95  soft-tissue]
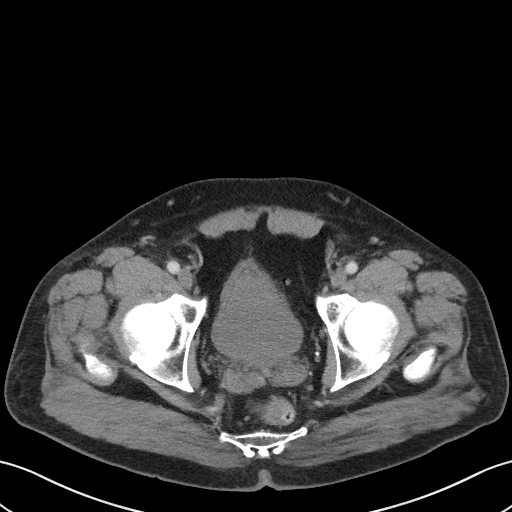
[im 20/95  soft-tissue]
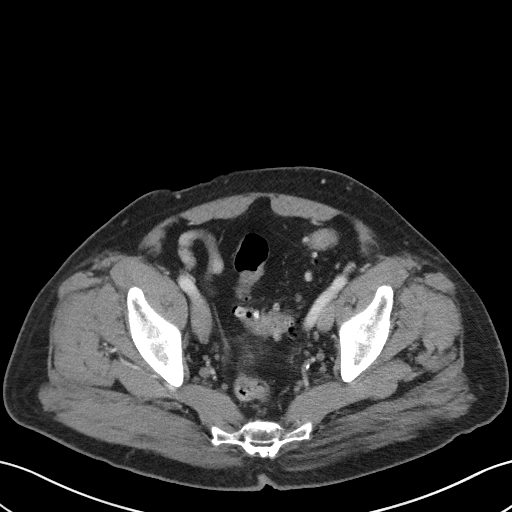
[im 25/95  soft-tissue]
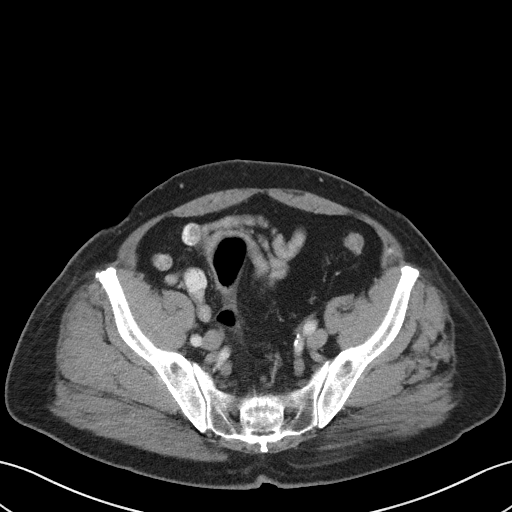
[im 35/95  soft-tissue]
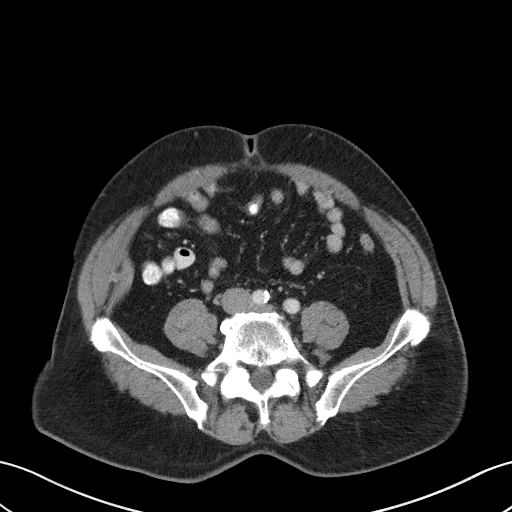
[im 40/95  soft-tissue]
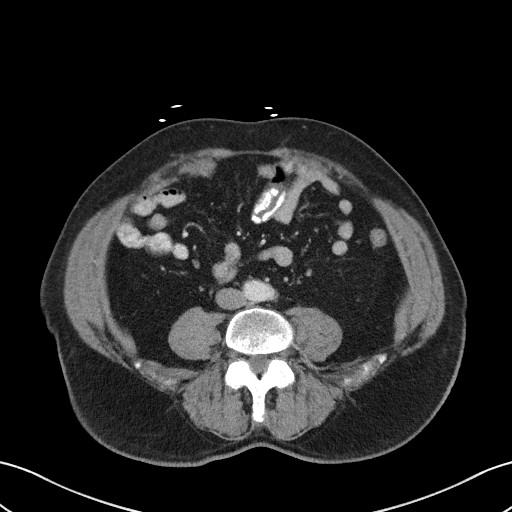
[im 50/95  soft-tissue]
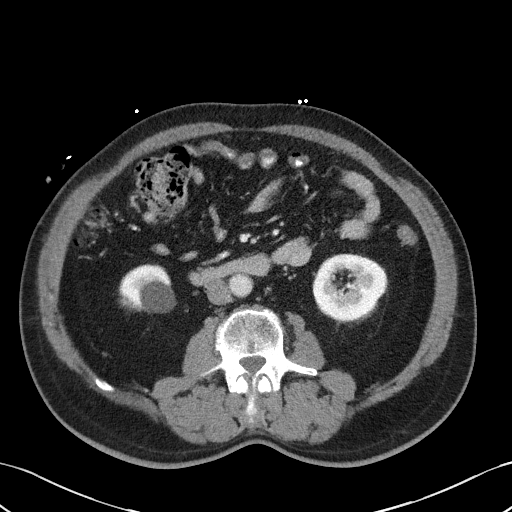
[im 55/95  soft-tissue]
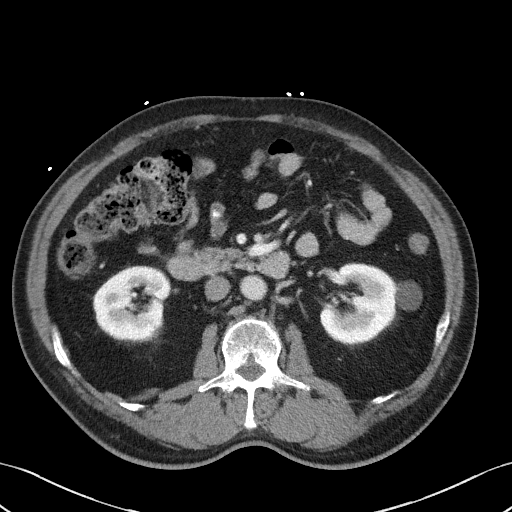
[im 60/95  soft-tissue]
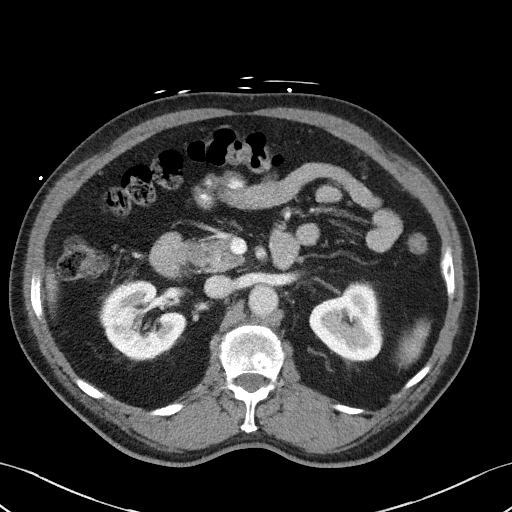
[im 60/95  bone]
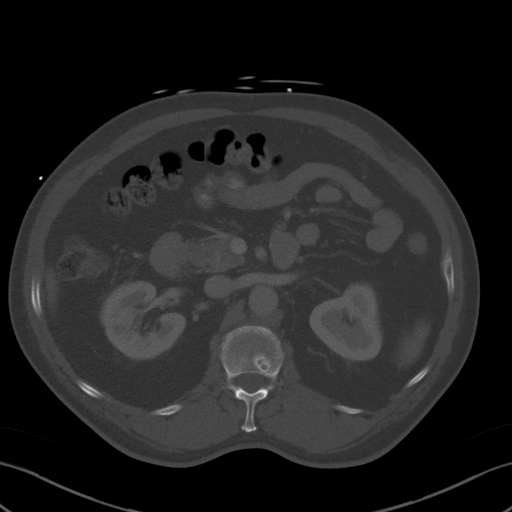
[im 70/95  soft-tissue]
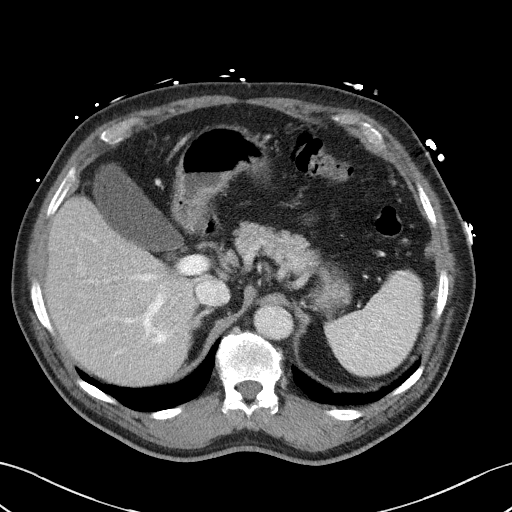
[im 75/95  soft-tissue]
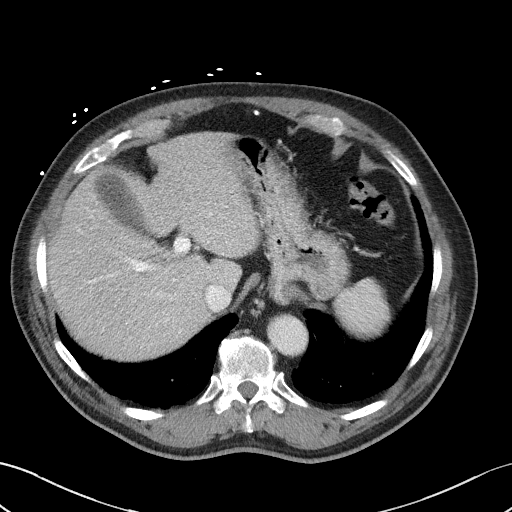
[im 80/95  soft-tissue]
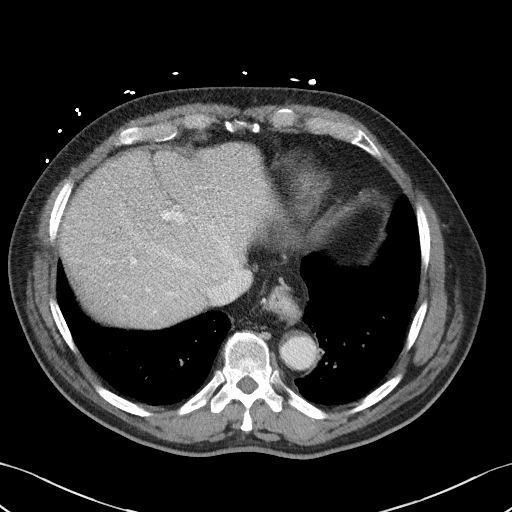
[im 90/95  soft-tissue]
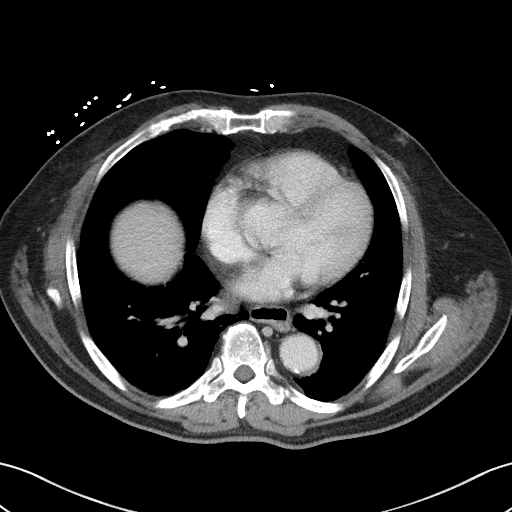

[Series 5: coronal st · coronal · 0.76mm/px · 3 of 92 slices shown]
[im 31/92  soft-tissue]
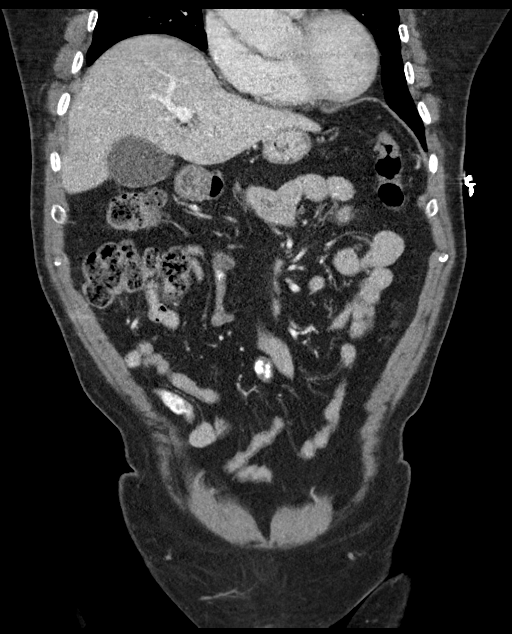
[im 41/92  soft-tissue]
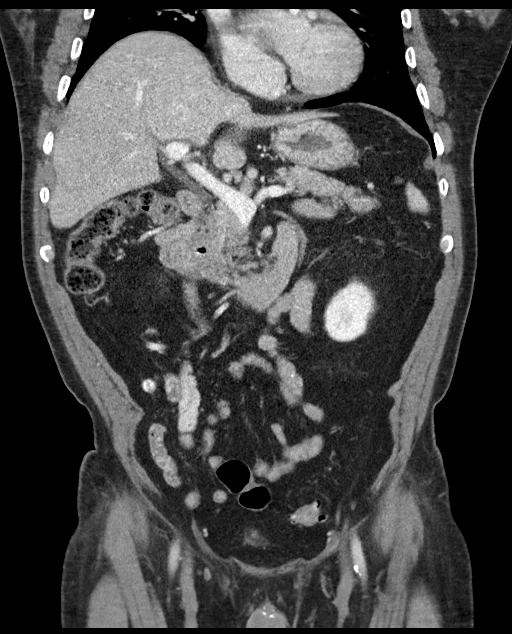
[im 51/92  soft-tissue]
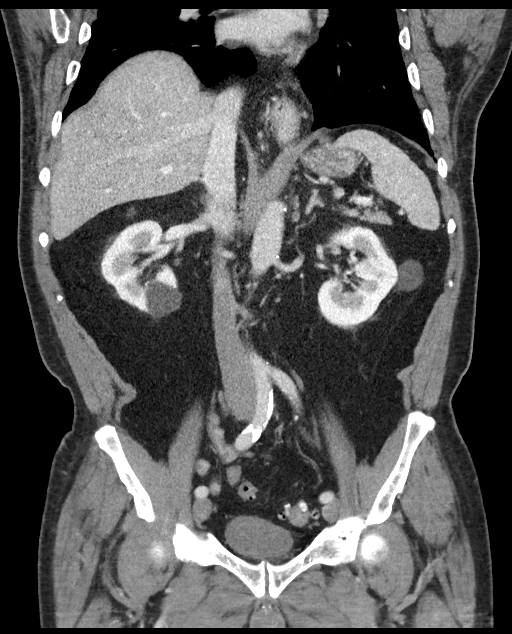

[16 of 46 positions shown; findings below may reference images not displayed]

FINDINGS: Lower chest: Mild bibasilar atelectasis. Small hiatal hernia. Mild
coronary artery calcification. Visualized heart and pericardium are
otherwise unremarkable.

Hepatobiliary: Mild hepatic steatosis. No enhancing liver lesion. No
intra or extrahepatic biliary ductal dilation. Gallbladder
unremarkable.

Pancreas: Unremarkable

Spleen: Unremarkable

Adrenals/Urinary Tract: The adrenal glands are unremarkable. The
kidneys are normal in size and position. Multiple simple cortical
cysts are seen bilaterally. The kidneys are otherwise unremarkable.
Submucosal fatty infiltration of the bladder wall anteriorly is
again identified, likely the result of remote inflammation. The
bladder is otherwise unremarkable.

Stomach/Bowel: Severe sigmoid and mild descending colonic
diverticulosis. The stomach, small bowel, and large bowel are
otherwise unremarkable. No evidence of obstruction or focal
inflammation. The appendix is unremarkable. No free intraperitoneal
gas or fluid.

Vascular/Lymphatic: Mild aortoiliac atherosclerotic calcification.
No aortic aneurysm. No pathologic adenopathy within the abdomen and
pelvis.

Reproductive: Moderate prostatic enlargement. Seminal vesicles
unremarkable.

Other: Rectum unremarkable.  Tiny fat containing umbilical hernia.

Musculoskeletal: Degenerative changes are seen within the lumbar
spine. No lytic or blastic bone lesions are seen. No acute bone
abnormality.
IMPRESSION: No acute intra-abdominal pathology identified. No definite
radiographic explanation for the patient's reported symptoms.
Incidental findings as noted above.

Aortic Atherosclerosis (FI997-1SD.D).

## 2023-01-23 DIAGNOSIS — N400 Enlarged prostate without lower urinary tract symptoms: Secondary | ICD-10-CM | POA: Insufficient documentation

## 2023-01-23 DIAGNOSIS — Z9189 Other specified personal risk factors, not elsewhere classified: Secondary | ICD-10-CM | POA: Insufficient documentation

## 2023-05-11 ENCOUNTER — Encounter: Payer: Self-pay | Admitting: Gastroenterology

## 2023-06-19 ENCOUNTER — Ambulatory Visit: Payer: Medicare Other

## 2023-06-19 VITALS — Ht 66.0 in | Wt 160.0 lb

## 2023-06-19 DIAGNOSIS — Z1211 Encounter for screening for malignant neoplasm of colon: Secondary | ICD-10-CM

## 2023-06-19 MED ORDER — NA SULFATE-K SULFATE-MG SULF 17.5-3.13-1.6 GM/177ML PO SOLN: 1.0000 | Freq: Once | ORAL | 0 refills | Status: AC

## 2023-06-19 NOTE — Progress Notes (Signed)
No egg or soy allergy known to patient  No issues known to pt with past sedation with any surgeries or procedures Patient denies ever being told they had issues or difficulty with intubation  No FH of Malignant Hyperthermia Pt is not on diet pills Pt is not on  home 02  Pt is not on blood thinners  Pt denies issues with constipation  No A fib or A flutter Have any cardiac testing pending--no  LOA: independent  Prep: spilt dose miralax   Patient's chart reviewed by Cathlyn Parsons CNRA prior to previsit and patient appropriate for the [facility].  Previsit completed and red dot placed by patient's name on their procedure day (on provider's schedule).     PV completed with patient. Prep instructions sent to home address

## 2023-07-08 ENCOUNTER — Encounter: Payer: Medicare Other | Admitting: Gastroenterology

## 2023-07-16 ENCOUNTER — Encounter: Payer: Self-pay | Admitting: Gastroenterology

## 2023-07-16 ENCOUNTER — Ambulatory Visit: Payer: Medicare Other | Admitting: Gastroenterology

## 2023-07-16 VITALS — BP 121/73 | HR 73 | Temp 97.5°F | Resp 16 | Ht 66.0 in | Wt 160.0 lb

## 2023-07-16 DIAGNOSIS — K5730 Diverticulosis of large intestine without perforation or abscess without bleeding: Secondary | ICD-10-CM | POA: Diagnosis not present

## 2023-07-16 DIAGNOSIS — Z1211 Encounter for screening for malignant neoplasm of colon: Secondary | ICD-10-CM

## 2023-07-16 DIAGNOSIS — D123 Benign neoplasm of transverse colon: Secondary | ICD-10-CM

## 2023-07-16 DIAGNOSIS — D122 Benign neoplasm of ascending colon: Secondary | ICD-10-CM

## 2023-07-16 DIAGNOSIS — K648 Other hemorrhoids: Secondary | ICD-10-CM

## 2023-07-16 MED ORDER — SODIUM CHLORIDE 0.9 % IV SOLN
500.0000 mL | Freq: Once | INTRAVENOUS | Status: DC
Start: 2023-07-16 — End: 2023-07-16

## 2023-07-16 NOTE — Progress Notes (Signed)
 Pt's states no medical or surgical changes since previsit or office visit.

## 2023-07-16 NOTE — Patient Instructions (Signed)

## 2023-07-16 NOTE — Progress Notes (Signed)
 Called to room to assist during endoscopic procedure.  Patient ID and intended procedure confirmed with present staff. Received instructions for my participation in the procedure from the performing physician.

## 2023-07-16 NOTE — Progress Notes (Signed)
 To pacu, VSS. Report to [email or web address]

## 2023-07-16 NOTE — Op Note (Signed)
 [facility] Patient Name: Douglas Boyd Procedure Date: 07/16/2023 2:49 PM MRN: [id number] Endoscopist: Viviann Spare P. Adela Lank , MD, [id number] Age: 78 Referring MD:  Date of Birth: 08-11-1945 Gender: Male Account #: [id number] Procedure:                Colonoscopy Indications:              Screening for colorectal malignant neoplasm Medicines:                Monitored Anesthesia Care Procedure:                Pre-Anesthesia Assessment:                           - Prior to the procedure, a History and Physical                            was performed, and patient medications and                            allergies were reviewed. The patient's tolerance of                            previous anesthesia was also reviewed. The risks                            and benefits of the procedure and the sedation                            options and risks were discussed with the patient.                            All questions were answered, and informed consent                            was obtained. Prior Anticoagulants: The patient has                            taken no anticoagulant or antiplatelet agents. ASA                            Grade Assessment: II - A patient with mild systemic                            disease. After reviewing the risks and benefits,                            the patient was deemed in satisfactory condition to                            undergo the procedure.                           After obtaining informed consent, the colonoscope  was passed under direct vision. Throughout the                            procedure, the patient's blood pressure, pulse, and                            oxygen saturations were monitored continuously. The                            [id number]-[id number] Colonoscope [id number] was introduced                            through the anus and advanced to the the cecum,                            identified by  appendiceal orifice and ileocecal                            valve. The colonoscopy was performed without                            difficulty. The patient tolerated the procedure                            well. The quality of the bowel preparation was                            good. The ileocecal valve, appendiceal orifice, and                            rectum were photographed. Scope In: 2:55:54 PM Scope Out: 3:16:16 PM Scope Withdrawal Time: 0 hours 16 minutes 27 seconds  Total Procedure Duration: 0 hours 20 minutes 22 seconds  Findings:                 The perianal and digital rectal examinations were                            normal.                           Two sessile polyps were found in the ascending                            colon. The polyps were 2 to 3 mm in size. These                            polyps were removed with a cold snare. Resection                            and retrieval were complete.                           A 5 mm polyp was found in the transverse colon. The  polyp was sessile. The polyp was removed with a                            cold snare. Resection and retrieval were complete.                           Many diverticula were found in the sigmoid colon.                           Internal hemorrhoids were found during retroflexion.                           The exam was otherwise without abnormality. Complications:            No immediate complications. Estimated blood loss:                            Minimal. Estimated Blood Loss:     Estimated blood loss was minimal. Impression:               - Two 2 to 3 mm polyps in the ascending colon,                            removed with a cold snare. Resected and retrieved.                           - One 5 mm polyp in the transverse colon, removed                            with a cold snare. Resected and retrieved.                           - Diverticulosis in the sigmoid  colon.                           - Internal hemorrhoids.                           - The examination was otherwise normal. Recommendation:           - Patient has a contact number available for                            emergencies. The signs and symptoms of potential                            delayed complications were discussed with the                            patient. Return to normal activities tomorrow.                            Written discharge instructions were provided to the  patient.                           - Resume previous diet.                           - Continue present medications.                           - Await pathology results. Viviann Spare P. Adela Lank, MD 07/16/2023 3:28:35 PM This report has been signed electronically.

## 2023-07-16 NOTE — Progress Notes (Signed)
 [name]   Primary Care Physician:  Andreas Blower., MD   Reason for Procedure:   Colon cancer screening  Plan:    colonoscopy     HPI: Douglas Boyd is a 78 y.o. male  here for colonoscopy screening - last exam 2015.   Patient denies any bowel symptoms at this time. No family history of colon cancer known. Otherwise feels well without any cardiopulmonary symptoms.   I have discussed risks / benefits of anesthesia and endoscopic procedure with Douglas Boyd and they wish to proceed with the exams as outlined today.    Past Medical History:  Diagnosis Date   Diverticulosis     Past Surgical History:  Procedure Laterality Date   COLONOSCOPY  2015    Prior to Admission medications   Medication Sig Start Date End Date Taking? Authorizing Provider  famotidine (PEPCID) 20 MG tablet Take 1 tablet by mouth 2 (two) times daily. 02/24/22  Yes [provider]  OVER THE COUNTER MEDICATION Take 1 tablet by mouth daily. Prostate health supplement   Yes [provider]  Multiple Vitamins-Minerals (PRESERVISION AREDS) CAPS Take 1 capsule by mouth daily at 6 (six) AM. Patient not taking: Reported on 07/16/2023    [provider]    Current Outpatient Medications  Medication Sig Dispense Refill   famotidine (PEPCID) 20 MG tablet Take 1 tablet by mouth 2 (two) times daily.     OVER THE COUNTER MEDICATION Take 1 tablet by mouth daily. Prostate health supplement     Multiple Vitamins-Minerals (PRESERVISION AREDS) CAPS Take 1 capsule by mouth daily at 6 (six) AM. (Patient not taking: Reported on 07/16/2023)     Current Facility-Administered Medications  Medication Dose Route Frequency Provider Last Rate Last Admin   0.9 %  sodium chloride infusion  500 mL Intravenous Once Meilah Delrosario, Willaim Rayas, MD        Allergies as of 07/16/2023 - Review Complete 07/16/2023  Allergen Reaction Noted   Bee venom Rash and Swelling 10/27/2016     Family History  Problem Relation Age of Onset   Diabetes Mother    Hypertension Mother    Colon cancer Neg Hx    Rectal cancer Neg Hx    Stomach cancer Neg Hx    Esophageal cancer Neg Hx     Social History   Socioeconomic History   Marital status: Married    Spouse name: Not on file   Number of children: 2   Years of education: Not on file   Highest education level: Not on file  Occupational History   Not on file  Tobacco Use   Smoking status: Never   Smokeless tobacco: Never  Vaping Use   Vaping status: Never Used  Substance and Sexual Activity   Alcohol use: Yes    Comment: once in a while   Drug use: Never   Sexual activity: Yes  Other Topics Concern   Not on file  Social History Narrative   Not on file   Social Drivers of Health   Financial Resource Strain: Not on file  Food Insecurity: Low Risk  (01/17/2023)   Received from [facility]   Hunger Vital Sign    Worried About Running Out of Food in the Last Year: Never true    Ran Out of Food in the Last Year: Never true  Transportation Needs: No Transportation Needs (01/17/2023)   Received from [facility]    In the past  12 months, has lack of reliable transportation kept you from medical appointments, meetings, work or from getting things needed for daily living? : No  Physical Activity: Not on file  Stress: Not on file  Social Connections: Not on file  Intimate Partner Violence: Not on file    Review of Systems: All other review of systems negative except as mentioned in the HPI.  Physical Exam: Vital signs BP (!) 111/56   Pulse (!) 57   Temp (!) 97.5 F (36.4 C) (Temporal)   Ht 5\' 6"  (1.676 m)   Wt 160 lb (72.6 kg)   SpO2 99%   BMI 25.82 kg/m   General:   Alert,  Well-developed, pleasant and cooperative in NAD Lungs:  Clear throughout to auscultation.   Heart:  Regular rate and rhythm Abdomen:  Soft, nontender and nondistended.   Neuro/Psych:  Alert and cooperative.  Normal mood and affect. A and O x 3  Harlin Rain, MD [facility]

## 2023-07-20 ENCOUNTER — Telehealth: Payer: Self-pay

## 2023-07-20 NOTE — Telephone Encounter (Signed)
  Follow up Call-     07/16/2023    2:25 PM  Call back number  Post procedure Call Back phone  # [phone]  Permission to leave phone message Yes     Patient questions:  Do you have a fever, pain , or abdominal swelling? No. Pain Score  0 *  Have you tolerated food without any problems? Yes.    Have you been able to return to your normal activities? Yes.    Do you have any questions about your discharge instructions: Diet   No. Medications  No. Follow up visit  No.  Do you have questions or concerns about your Care? No.  Actions: * If pain score is 4 or above: No action needed, pain <4.

## 2023-07-21 ENCOUNTER — Encounter: Payer: Self-pay | Admitting: Gastroenterology

## 2023-07-21 LAB — SURGICAL PATHOLOGY
